# Patient Record
Sex: Female | Born: 1995 | Hispanic: No | Smoking: Never smoker
Health system: Southern US, Community
[De-identification: ages and names within clinical notes are randomized; demographics above are authoritative.]

---

## 2020-09-01 ENCOUNTER — Other Ambulatory Visit: Payer: Self-pay

## 2020-09-01 ENCOUNTER — Emergency Department (HOSPITAL_COMMUNITY)
Admission: EM | Admit: 2020-09-01 | Discharge: 2020-09-01 | Disposition: A | Discharge status: Home / Self Care | Payer: Self-pay | Attending: Emergency Medicine | Admitting: Emergency Medicine

## 2020-09-01 ENCOUNTER — Emergency Department (HOSPITAL_COMMUNITY): Payer: Self-pay

## 2020-09-01 ENCOUNTER — Encounter (HOSPITAL_COMMUNITY): Payer: Self-pay

## 2020-09-01 DIAGNOSIS — R079 Chest pain, unspecified: Secondary | ICD-10-CM

## 2020-09-01 DIAGNOSIS — R072 Precordial pain: Secondary | ICD-10-CM | POA: Insufficient documentation

## 2020-09-01 DIAGNOSIS — R42 Dizziness and giddiness: Secondary | ICD-10-CM | POA: Insufficient documentation

## 2020-09-01 DIAGNOSIS — R509 Fever, unspecified: Secondary | ICD-10-CM | POA: Insufficient documentation

## 2020-09-01 DIAGNOSIS — M940 Chondrocostal junction syndrome [Tietze]: Secondary | ICD-10-CM | POA: Insufficient documentation

## 2020-09-01 DIAGNOSIS — R059 Cough, unspecified: Secondary | ICD-10-CM | POA: Insufficient documentation

## 2020-09-01 DIAGNOSIS — Z20822 Contact with and (suspected) exposure to covid-19: Secondary | ICD-10-CM | POA: Insufficient documentation

## 2020-09-01 LAB — I-STAT BETA HCG BLOOD, ED (MC, WL, AP ONLY): I-stat hCG, quantitative: <5 m[IU]/mL (ref <5)

## 2020-09-01 LAB — CBC WITH DIFFERENTIAL/PLATELET
Abs Immature Granulocytes: 0.01 10*3/uL (ref 0.00–0.07)
Basophils Absolute: 0 10*3/uL (ref 0.0–0.1)
Basophils Relative: 0 %
Eosinophils Absolute: 0 10*3/uL (ref 0.0–0.5)
Eosinophils Relative: 0 %
HCT: 34.7 % — ABNORMAL LOW (ref 36.0–46.0)
Hemoglobin: 12.3 g/dL (ref 12.0–15.0)
Immature Granulocytes: 0 %
Lymphocytes Relative: 18 %
Lymphs Abs: 1.3 10*3/uL (ref 0.7–4.0)
MCH: 32.1 pg (ref 26.0–34.0)
MCHC: 35.4 g/dL (ref 30.0–36.0)
MCV: 90.6 fL (ref 80.0–100.0)
Monocytes Absolute: 0.7 10*3/uL (ref 0.1–1.0)
Monocytes Relative: 9 %
Neutro Abs: 5.3 10*3/uL (ref 1.7–7.7)
Neutrophils Relative %: 73 %
Platelets: 209 10*3/uL (ref 150–400)
RBC: 3.83 MIL/uL — ABNORMAL LOW (ref 3.87–5.11)
RDW: 11.8 % (ref 11.5–15.5)
WBC: 7.3 10*3/uL (ref 4.0–10.5)
nRBC: 0 % (ref 0.0–0.2)

## 2020-09-01 LAB — BASIC METABOLIC PANEL
Anion gap: 9 (ref 5–15)
BUN: 5 mg/dL — ABNORMAL LOW (ref 6–20)
CO2: 24 mmol/L (ref 22–32)
Calcium: 8.7 mg/dL — ABNORMAL LOW (ref 8.9–10.3)
Chloride: 100 mmol/L (ref 98–111)
Creatinine, Ser: 0.72 mg/dL (ref 0.44–1.00)
GFR, Estimated: >60 mL/min (ref >60)
Glucose, Bld: 116 mg/dL — ABNORMAL HIGH (ref 70–99)
Potassium: 2.9 mmol/L — ABNORMAL LOW (ref 3.5–5.1)
Sodium: 133 mmol/L — ABNORMAL LOW (ref 135–145)

## 2020-09-01 LAB — D-DIMER, QUANTITATIVE: D-Dimer, Quant: 0.35 ug/mL-FEU (ref 0.00–0.50)

## 2020-09-01 LAB — RESP PANEL BY RT-PCR (FLU A&B, COVID) ARPGX2
Influenza A by PCR: POSITIVE — AB
Influenza B by PCR: NEGATIVE
SARS Coronavirus 2 by RT PCR: NEGATIVE

## 2020-09-01 MED ORDER — SODIUM CHLORIDE 0.9 % IV BOLUS
1000.0000 mL | Freq: Once | INTRAVENOUS | Status: AC
  Administered 2020-09-01: 1000 mL via INTRAVENOUS

## 2020-09-01 MED ORDER — POTASSIUM CHLORIDE CRYS ER 20 MEQ PO TBCR
40.0000 meq | EXTENDED_RELEASE_TABLET | Freq: Once | ORAL | Status: DC
  Filled 2020-09-01: qty 2

## 2020-09-01 MED ORDER — IBUPROFEN 400 MG PO TABS
600.0000 mg | ORAL_TABLET | Freq: Once | ORAL | Status: AC
  Administered 2020-09-01: 600 mg via ORAL
  Filled 2020-09-01: qty 1

## 2020-09-01 MED ORDER — POTASSIUM CHLORIDE 20 MEQ PO PACK
40.0000 meq | PACK | Freq: Once | ORAL | Status: AC
  Administered 2020-09-01: 40 meq via ORAL
  Filled 2020-09-01: qty 2

## 2020-09-01 MED ORDER — BENZONATATE 100 MG PO CAPS: 100.0000 mg | ORAL_CAPSULE | Freq: Three times a day (TID) | ORAL | 0 refills | Status: AC

## 2020-09-01 NOTE — Discharge Instructions (Addendum)
Your lab work is reassuring as well as your EKG and chest Xray. Your potassium was low, this could be caused by decreased oral intake, the lamictal can also cause decreased potassium. Please increase your potassium intake by eating potassium-rich foods: spinach, avocados, bananas, fish. Then please make a follow up with your primary care provider for recheck of your electrolytes next week.   I have given you some tessalon capsules to help with your cough. You can take motrin and tylenol as needed for your pain.

## 2020-09-01 NOTE — ED Provider Notes (Signed)
[facility] EMERGENCY DEPARTMENT Provider Note   CSN: [id number] Arrival date & time: 09/01/20  0848     History No chief complaint on file.  Megan Simon is a 25 y.o. female.  Patient presents with fever and non-productive cough that started five days ago. Fever resolved two days ago, cough continues. Now having mid-sternal chest pain that is constant and sharp. Pain is worse when she coughs and takes deep breaths. Reports pain is 4/10 at all times and when she coughs it increases to 10/10. She has taken robitussin last night for the cough, no meds today. Denies any syncopal episodes but endorses that she was feeling a bit dizzy PTA. Denies numbness/tingling in extremities, no radiation of pain. She does take birth control, was supposed to get her period last week but she has not had it yet.   The history is provided by the patient.  Chest Pain Pain location:  Substernal area Pain quality: sharp   Pain radiates to:  Does not radiate Pain severity:  Mild Duration:  1 day Timing:  Constant Progression:  Unchanged Chronicity:  New Context: breathing   Worsened by:  Nothing Associated symptoms: cough, dizziness and fever   Associated symptoms: no abdominal pain, no altered mental status, no near-syncope, no numbness, no orthopnea, no shortness of breath, no syncope and no vomiting   Cough:    Cough characteristics:  Non-productive   Timing:  Constant   Chronicity:  New Risk factors: birth control   Risk factors: no immobilization, not female, not obese and no prior DVT/PE        History reviewed. No pertinent past medical history.  There are no problems to display for this patient.   History reviewed. No pertinent surgical history.   OB History   No obstetric history on file.     No family history on file.     Home Medications Prior to Admission medications   Medication Sig Start Date End Date Taking? Authorizing Provider   benzonatate (TESSALON) 100 MG capsule Take 1 capsule (100 mg total) by mouth every 8 (eight) hours. 09/01/20  Yes Orma Flaming, NP    Allergies    Patient has no allergy information on record.  Review of Systems   Review of Systems  Constitutional: Positive for fever.  Respiratory: Positive for cough. Negative for shortness of breath.   Cardiovascular: Positive for chest pain. Negative for orthopnea, syncope and near-syncope.  Gastrointestinal: Negative for abdominal pain and vomiting.  Genitourinary: Negative for dysuria.  Neurological: Positive for dizziness. Negative for numbness.  All other systems reviewed and are negative.   Physical Exam Updated Vital Signs BP 100/67   Pulse 75   Temp 99.6 F (37.6 C) (Oral)   Resp 14   Wt 48.1 kg   SpO2 99%   Physical Exam Vitals and nursing note reviewed.  Constitutional:      General: She is not in acute distress.    Appearance: Normal appearance. She is well-developed. She is not ill-appearing.  HENT:     Head: Normocephalic and atraumatic.     Nose: Nose normal.     Mouth/Throat:     Mouth: Mucous membranes are moist.     Pharynx: Oropharynx is clear.  Eyes:     Extraocular Movements: Extraocular movements intact.     Conjunctiva/sclera: Conjunctivae normal.     Pupils: Pupils are equal, round, and reactive to light.  Neck:     Meningeal: Brudzinski's sign  and Kernig's sign absent.  Cardiovascular:     Rate and Rhythm: Normal rate and regular rhythm.     Pulses: Normal pulses.     Heart sounds: Normal heart sounds, S1 normal and S2 normal. No murmur heard.   Pulmonary:     Effort: Pulmonary effort is normal. No tachypnea, accessory muscle usage, respiratory distress or retractions.     Breath sounds: Normal breath sounds. No wheezing or rhonchi.  Chest:     Chest wall: Tenderness present. No crepitus or edema.  Abdominal:     General: Abdomen is flat. Bowel sounds are normal. There is no distension.      Palpations: Abdomen is soft. There is no hepatomegaly or splenomegaly.     Tenderness: There is no abdominal tenderness. There is no right CVA tenderness, left CVA tenderness, guarding or rebound.  Musculoskeletal:        General: Normal range of motion.     Cervical back: Full passive range of motion without pain, normal range of motion and neck supple. Normal range of motion.  Skin:    General: Skin is warm and dry.     Capillary Refill: Capillary refill takes less than 2 seconds.  Neurological:     General: No focal deficit present.     Mental Status: She is alert and oriented to person, place, and time. Mental status is at baseline.     GCS: GCS eye subscore is 4. GCS verbal subscore is 5. GCS motor subscore is 6.     ED Results / Procedures / Treatments   Labs (all labs ordered are listed, but only abnormal results are displayed) Labs Reviewed  CBC WITH DIFFERENTIAL/PLATELET - Abnormal; Notable for the following components:      Result Value   RBC 3.83 (*)    HCT 34.7 (*)    All other components within normal limits  BASIC METABOLIC PANEL - Abnormal; Notable for the following components:   Sodium 133 (*)    Potassium 2.9 (*)    Glucose, Bld 116 (*)    BUN 5 (*)    Calcium 8.7 (*)    All other components within normal limits  RESP PANEL BY RT-PCR (FLU A&B, COVID) ARPGX2  D-DIMER, QUANTITATIVE  MAGNESIUM  I-STAT BETA HCG BLOOD, ED ([facility], [facility], [facility] ONLY)    EKG None  Radiology DG Chest 2 View  Result Date: 09/01/2020 CLINICAL DATA:  Cough and chest pain for 2 days EXAM: CHEST - 2 VIEW COMPARISON:  None. FINDINGS: The heart size and mediastinal contours are within normal limits. Both lungs are clear. The visualized skeletal structures are unremarkable. IMPRESSION: No active cardiopulmonary disease. Electronically Signed   By: Alcide Clever M.D.   On: 09/01/2020 10:02    Procedures Procedures   Medications Ordered in ED Medications  ibuprofen (ADVIL) tablet 600 mg (600 mg  Oral Given 09/01/20 1119)  sodium chloride 0.9 % bolus 1,000 mL (0 mLs Intravenous Stopped 09/01/20 1244)  potassium chloride (KLOR-CON) packet 40 mEq (40 mEq Oral Given 09/01/20 1238)    ED Course  I have reviewed the triage vital signs and the nursing notes.  Pertinent labs & imaging results that were available during my care of the patient were reviewed by me and considered in my medical decision making (see chart for details).    MDM Rules/Calculators/A&P                          24  yo F with sternal CP that worsened today. Began with fever and nonproductive cough 5 days ago. Fever has resolved, cough continues. Pain worse with coughing and deep breathing. No meds PTA, took robitussin last night with little relief in symptoms. She does take birth control.  On exam she is alert, non-toxic. GCS 15. VSS. Lungs CTAB. TTP to sternal chest wall. Pain does not radiate per patient. No numbness/tingling in extremities. RRR.   CXR on my review is unremarkable. EKG shows non-specific t-wave abnormalities, NSR. PERC criteria 1, will obtain d-dimer along with CBC, BMP and give 1L NS. Suspect likely costochondritis but will r/o PE. Will re-eval.   1230: hCG neg. D dimer 0.35, does not meet criteria for CTa. CBC unremarkable. BMP shows hypokalemia to 2.9--replaced with 40 meq PO potassium. Patient states that she doesn't eat much food d/t decreased appetite from adderrall. Recommend increasing PO intake of potassium and having electrolytes recheck by PCP next week.   Believe CP is likely from costochondritis. Will send home with tessalon pearls and discussed symptomatic and supportive care. ED return precautions provided.   Discussed with my attending, Dr. Jodi Mourning, HPI and plan of care for this patient. The attending physician offered recommendations and input on course of action for this patient.   Final Clinical Impression(s) / ED Diagnoses Final diagnoses:  Cough  Chest pain in adult   Costochondritis    Rx / DC Orders ED Discharge Orders         Ordered    benzonatate (TESSALON) 100 MG capsule  Every 8 hours        09/01/20 1054           Orma Flaming, NP 09/01/20 1245    Blane Ohara, MD 09/02/20 [phone]

## 2020-09-01 NOTE — ED Triage Notes (Signed)
Patient complains of sob and chest wall pain with inspiration and cough, states worse with any movement. Reports fever over the weekend and had Covid in january

## 2021-04-25 ENCOUNTER — Other Ambulatory Visit: Payer: Self-pay

## 2021-04-25 ENCOUNTER — Emergency Department: Admit: 2021-04-25 | Discharge status: Absent without leave | Payer: Self-pay

## 2021-04-25 ENCOUNTER — Emergency Department
Admission: EM | Admit: 2021-04-25 | Discharge: 2021-04-25 | Disposition: A | Discharge status: Home / Self Care | Payer: Self-pay | Source: Home / Self Care

## 2021-04-25 ENCOUNTER — Encounter: Payer: Self-pay | Admitting: Emergency Medicine

## 2021-04-25 DIAGNOSIS — H109 Unspecified conjunctivitis: Secondary | ICD-10-CM

## 2021-04-25 MED ORDER — SULFACETAMIDE SODIUM 10 % OP SOLN: 1.0000 [drp] | Freq: Three times a day (TID) | OPHTHALMIC | 0 refills | Status: AC

## 2021-04-25 NOTE — Discharge Instructions (Addendum)
Advised/instructed patient to instill eyedrops as directed.  Advised patient may use OTC blink tears in between sulfacetamide drops for dry eye.

## 2021-04-25 NOTE — ED Provider Notes (Signed)
[facility]    CSN: [id number] Arrival date & time: 04/25/21  1654      History   Chief Complaint Chief Complaint  Patient presents with   Conjunctivitis    right    HPI Megan Simon is a 25 y.o. female.   HPI 25 year old female presents with right eye redness for 1 day.  History reviewed. No pertinent past medical history.  There are no problems to display for this patient.   History reviewed. No pertinent surgical history.  OB History   No obstetric history on file.      Home Medications    Prior to Admission medications   Medication Sig Start Date End Date Taking? Authorizing Provider  amphetamine-dextroamphetamine (ADDERALL XR) 15 MG 24 hr capsule Take by mouth. 01/10/21  Yes [provider]  desogestrel-ethinyl estradiol (ISIBLOOM) 0.15-30 MG-MCG tablet Take 1 tablet by mouth daily. 06/29/20  Yes [provider]  sertraline (ZOLOFT) 100 MG tablet Take by mouth. 06/29/20  Yes [provider]  sulfacetamide (BLEPH-10) 10 % ophthalmic solution Place 1-2 drops into the right eye in the morning, at noon, and at bedtime for 5 days. 04/25/21 04/30/21 Yes Trevor Iha, FNP  benzonatate (TESSALON) 100 MG capsule Take 1 capsule (100 mg total) by mouth every 8 (eight) hours. Patient not taking: Reported on 04/25/2021 09/01/20   Orma Flaming, NP  lamoTRIgine (LAMICTAL) 200 MG tablet Take 200 mg by mouth daily. 03/16/21   [provider]    Family History Family History  Problem Relation Age of Onset   Healthy Mother     Social History Social History   Tobacco Use   Smoking status: Never    Passive exposure: Never   Smokeless tobacco: Never  Vaping Use   Vaping Use: Never used  Substance Use Topics   Alcohol use: Not Currently   Drug use: Not Currently     Allergies   Patient has no known allergies.   Review of Systems Review of Systems  Eyes:  Positive for redness and itching.  All other  systems reviewed and are negative.   Physical Exam Triage Vital Signs ED Triage Vitals  Enc Vitals Group     BP 04/25/21 1721 105/71     Pulse Rate 04/25/21 1721 97     Resp 04/25/21 1721 15     Temp 04/25/21 1721 100 F (37.8 C)     Temp Source 04/25/21 1721 Oral     SpO2 04/25/21 1721 99 %     Weight 04/25/21 1724 110 lb (49.9 kg)     Height 04/25/21 1724 5' (1.524 m)     Head Circumference --      Peak Flow --      Pain Score 04/25/21 1722 4     Pain Loc --      Pain Edu? --      Excl. in GC? --    No data found.  Updated Vital Signs BP 105/71 (BP Location: Left Arm)   Pulse 97   Temp 100 F (37.8 C) (Oral)   Resp 15   Ht 5' (1.524 m)   Wt 110 lb (49.9 kg)   LMP 04/01/2021 (Approximate)   SpO2 99%   BMI 21.48 kg/m    Physical Exam Vitals and nursing note reviewed.  Constitutional:      General: She is not in acute distress.    Appearance: Normal appearance. She is normal weight. She is not ill-appearing.  HENT:     Head: Normocephalic and atraumatic.     Mouth/Throat:     Mouth: Mucous membranes are moist.     Pharynx: Oropharynx is clear.  Eyes:     Extraocular Movements: Extraocular movements intact.     Pupils: Pupils are equal, round, and reactive to light.     Comments: Right conjunctiva +2 injection  Cardiovascular:     Rate and Rhythm: Normal rate and regular rhythm.     Pulses: Normal pulses.     Heart sounds: Normal heart sounds.  Pulmonary:     Effort: Pulmonary effort is normal.     Breath sounds: Normal breath sounds.  Musculoskeletal:        General: Normal range of motion.     Cervical back: Normal range of motion and neck supple.  Skin:    General: Skin is warm and dry.  Neurological:     General: No focal deficit present.     Mental Status: She is alert and oriented to person, place, and time.     UC Treatments / Results  Labs (all labs ordered are listed, but only abnormal results are displayed) Labs Reviewed - No data to  display  EKG   Radiology No results found.  Procedures Procedures (including critical care time)  Medications Ordered in UC Medications - No data to display  Initial Impression / Assessment and Plan / UC Course  I have reviewed the triage vital signs and the nursing notes.  Pertinent labs & imaging results that were available during my care of the patient were reviewed by me and considered in my medical decision making (see chart for details).     MDM: 1.  Conjunctivitis of right eye-Rx'd Sulfacetamide. Advised/instructed patient to instill eyedrops as directed.  Advised patient may use OTC blink tears in between sulfacetamide drops for dry eye.  Patient discharged home, hemodynamically stable. Final Clinical Impressions(s) / UC Diagnoses   Final diagnoses:  Conjunctivitis of right eye, unspecified conjunctivitis type     Discharge Instructions      Advised/instructed patient to instill eyedrops as directed.  Advised patient may use OTC blink tears in between sulfacetamide drops for dry eye.     ED Prescriptions     Medication Sig Dispense Auth. Provider   sulfacetamide (BLEPH-10) 10 % ophthalmic solution Place 1-2 drops into the right eye in the morning, at noon, and at bedtime for 5 days. 5 mL Trevor Iha, FNP      PDMP not reviewed this encounter.   Trevor Iha, FNP 04/25/21 [phone]

## 2021-04-25 NOTE — ED Triage Notes (Signed)
Red to sclera of  right eye  Drainage to right eye  Temp 100 in triage No OTC meds  Congestion remains  from sinus drainage 1 week ago  No flu vaccine  Pt is in law school

## 2022-01-01 IMAGING — DX DG CHEST 2V
2 series · 2 of 2 positions shown · non-contrast
Comparison: None.

CLINICAL DATA: Cough and chest pain for 2 days

EXAM:
CHEST - 2 VIEW

[x chest ap]
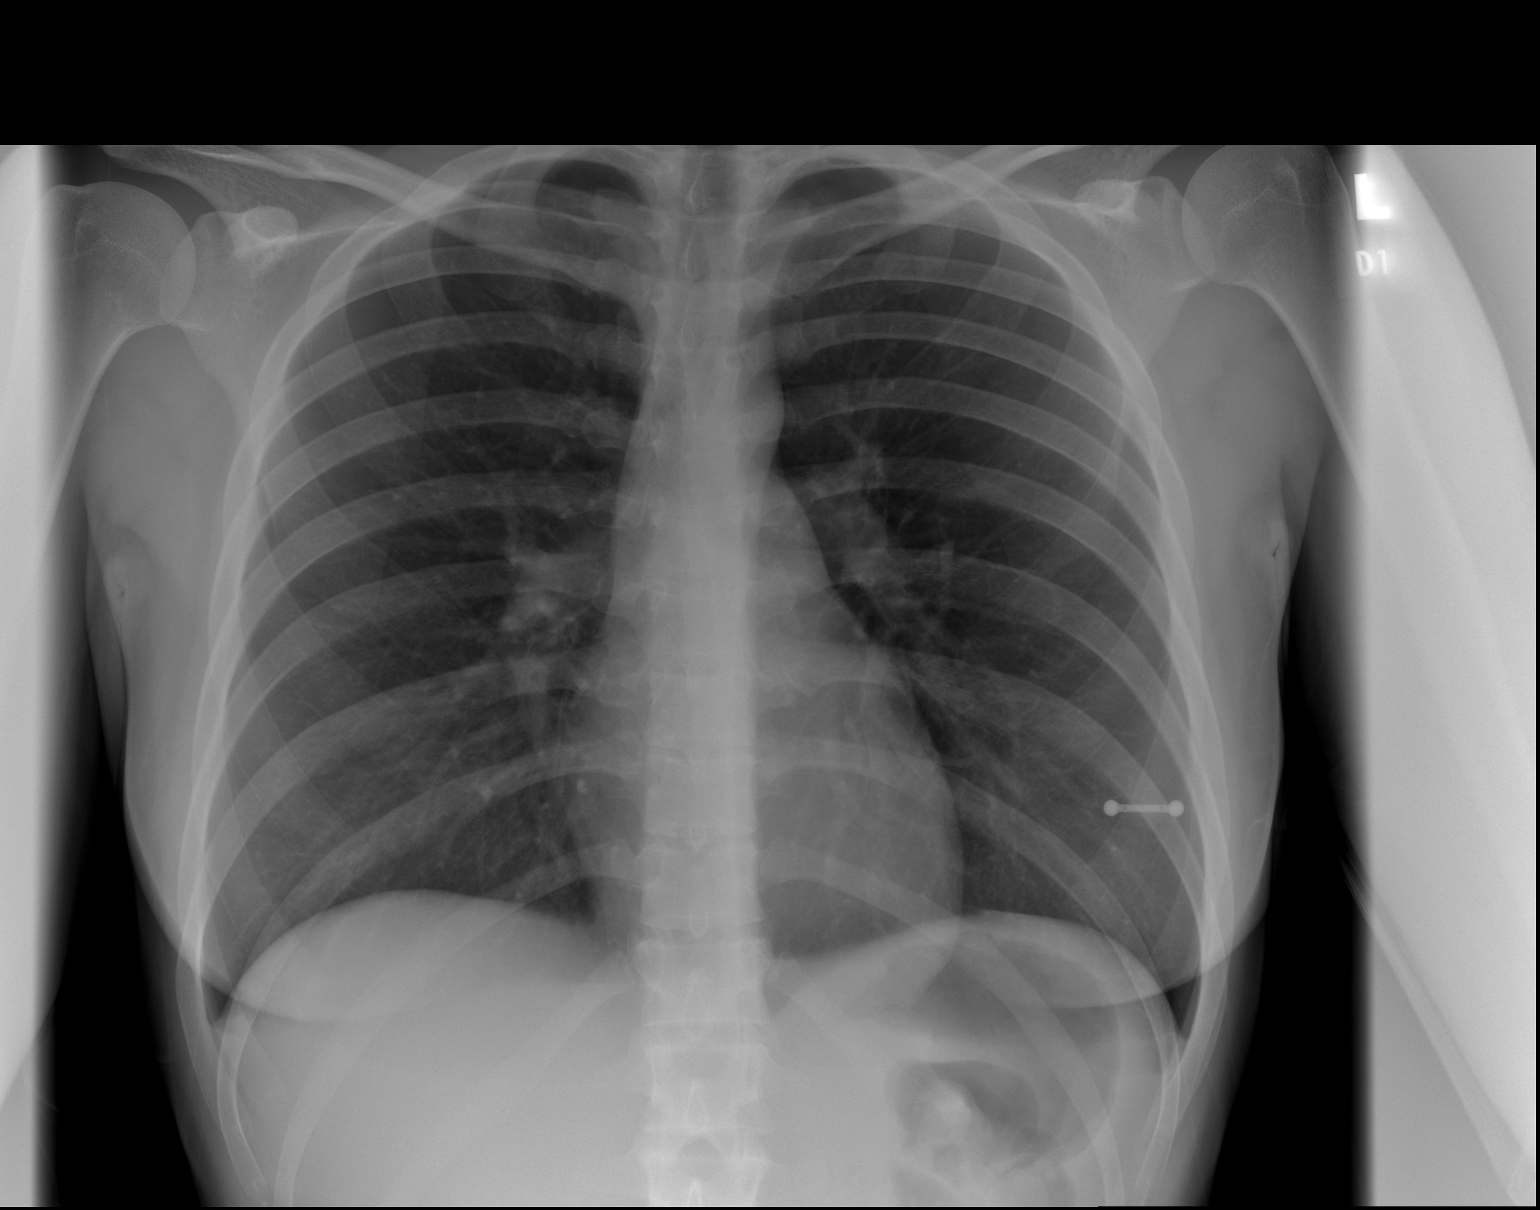

[w chest lat]
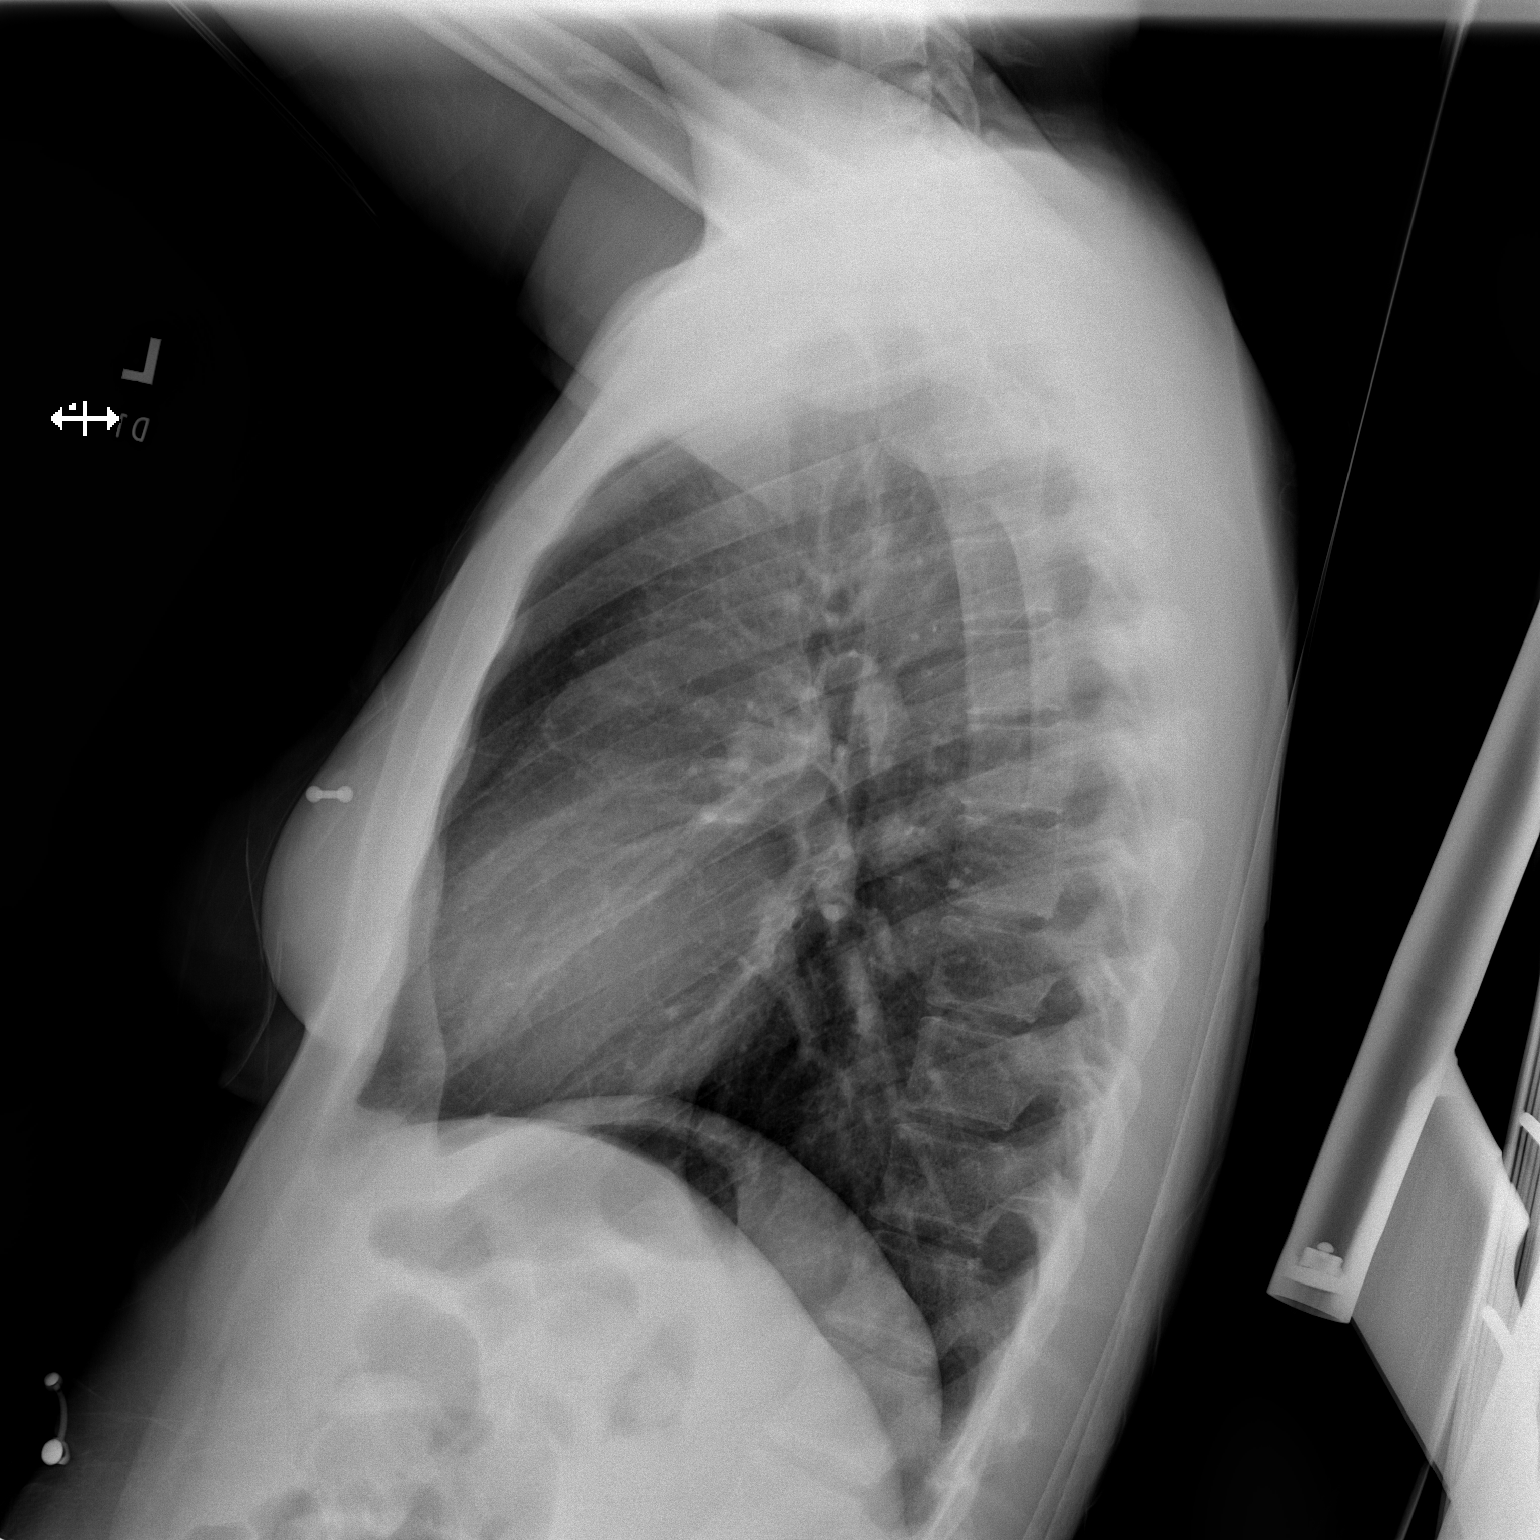

[2 of 2 positions shown; findings below may reference images not displayed]

FINDINGS: The heart size and mediastinal contours are within normal limits.
Both lungs are clear. The visualized skeletal structures are
unremarkable.
IMPRESSION: No active cardiopulmonary disease.
